# Patient Record
Sex: Male | Born: 1980 | Hispanic: Yes | Marital: Single | Smoking: Never smoker
Health system: Southern US, Community
[De-identification: ages and names within clinical notes are randomized; demographics above are authoritative.]

## PROBLEM LIST (undated history)

## (undated) HISTORY — PX: REPAIR KNEE LIGAMENT: SUR1188

---

## 2008-03-05 ENCOUNTER — Emergency Department: Payer: Self-pay | Admitting: Internal Medicine

## 2008-03-15 ENCOUNTER — Emergency Department: Payer: Self-pay | Admitting: Emergency Medicine

## 2008-09-06 IMAGING — CR RIGHT INDEX FINGER 2+V
1 series · 6 of 6 positions shown · non-contrast
Comparison: none

REASON FOR EXAM: injury from a moving machine laceration noted mc 4
COMMENTS:

[Series 1: view not recorded · 0.17mm/px · 6 of 6 slices shown]
[im 1/6]
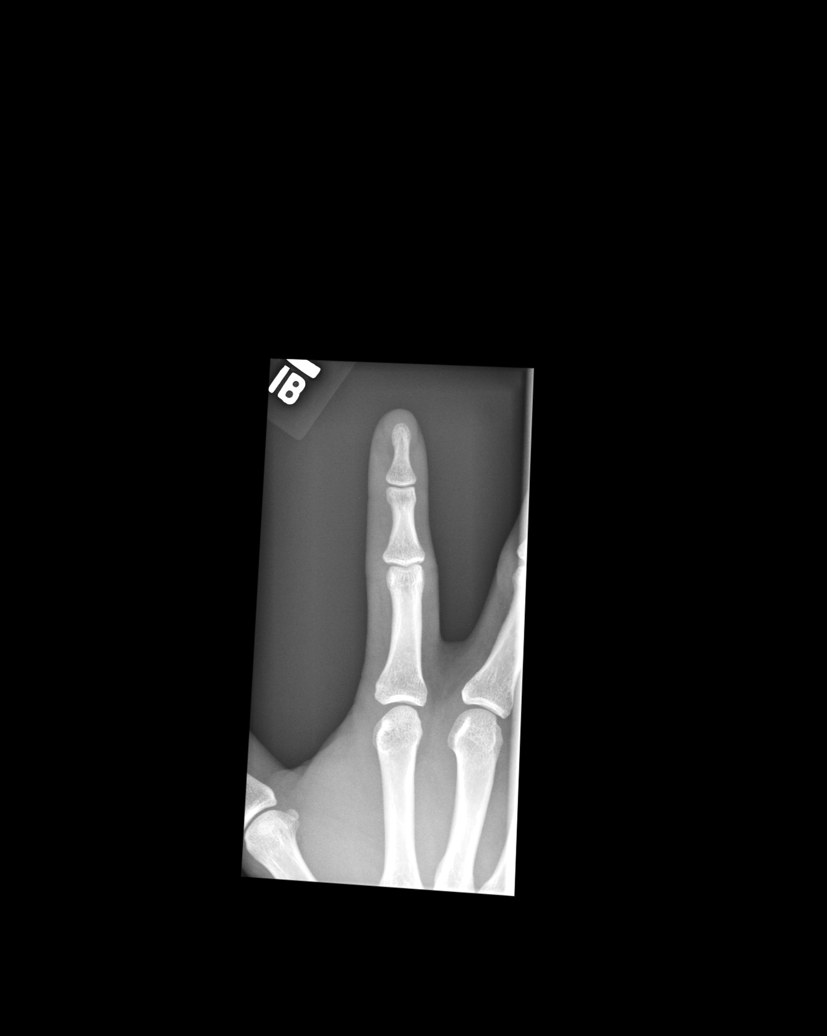
[im 2/6]
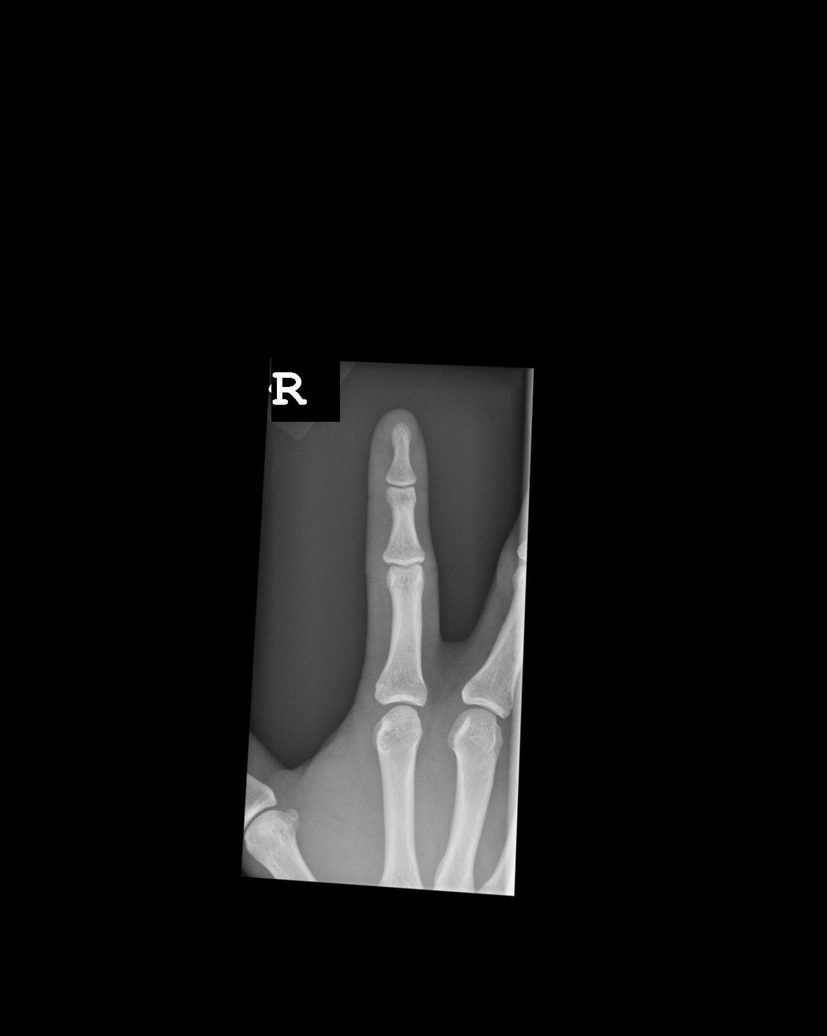
[im 3/6]
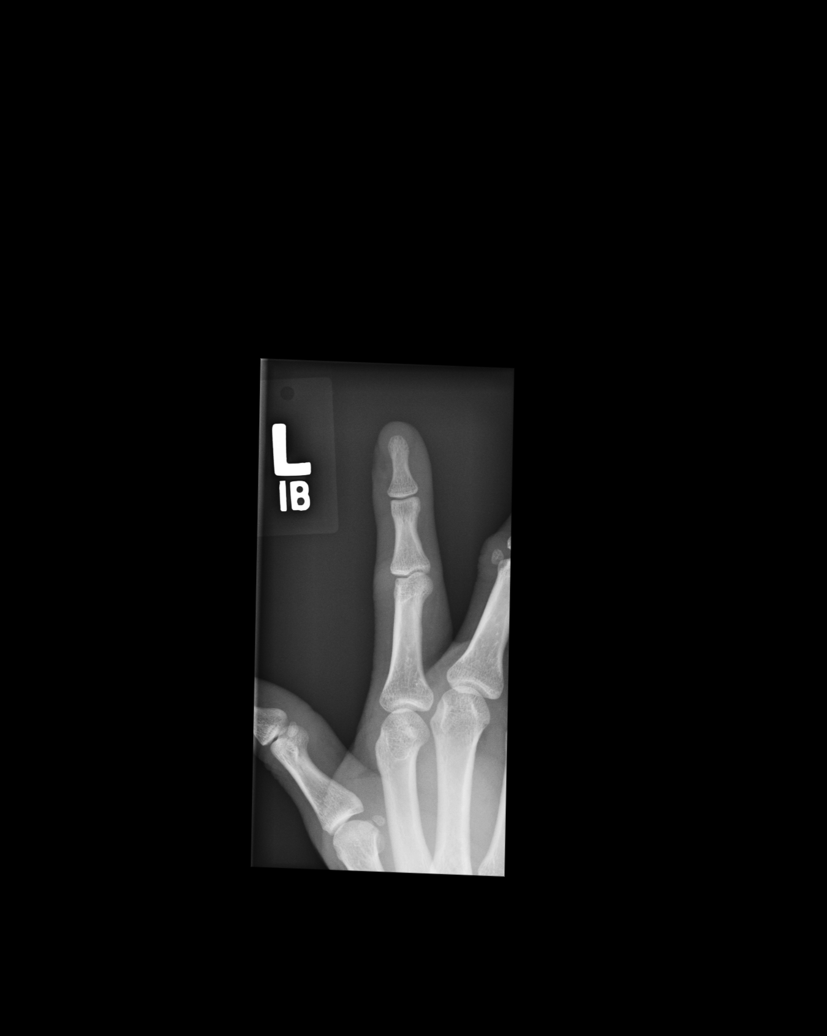
[im 4/6]
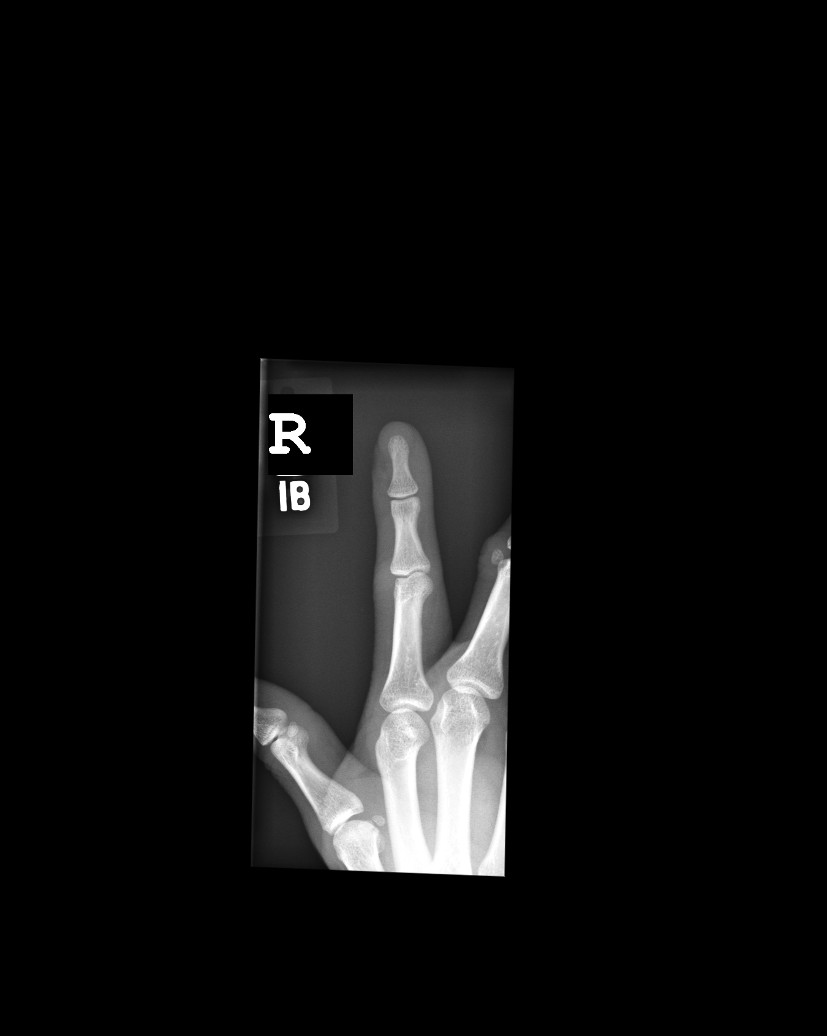
[im 5/6]
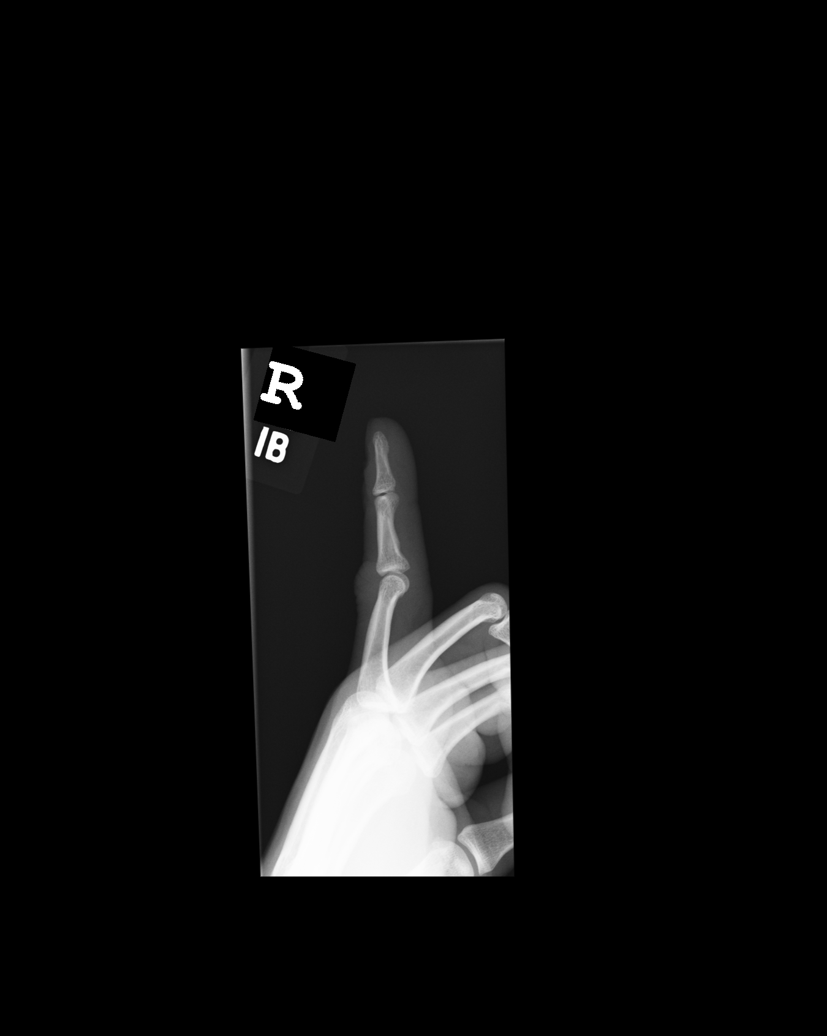
[im 6/6]
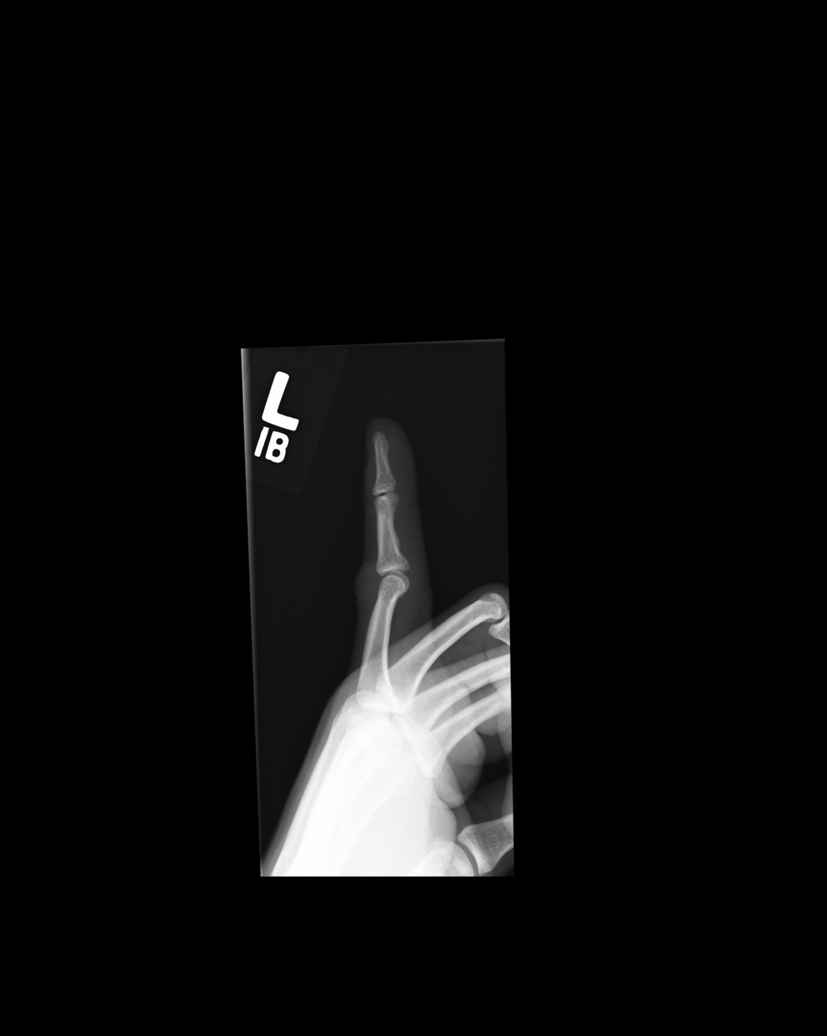

[6 of 6 positions shown; findings below may reference images not displayed]

PROCEDURE:     DXR - DXR FINGER INDEX 2ND DIGIT RT HA  - March 05, 2008 [DATE]

RESULT:     Three views of the RIGHT index finger were obtained. No
fracture, dislocation or other acute bony abnormality is seen. There is
apparent soft tissue swelling about the PIP joint.

In PACS, the views were initially labeled LEFT but have been relabeled RIGHT.
IMPRESSION: 1.     No significant osseous abnormalities are noted.

## 2015-04-26 ENCOUNTER — Other Ambulatory Visit: Payer: Self-pay

## 2015-04-26 ENCOUNTER — Emergency Department
Admission: EM | Admit: 2015-04-26 | Discharge: 2015-04-26 | Disposition: A | Discharge status: Home / Self Care | Payer: Self-pay | Attending: Emergency Medicine | Admitting: Emergency Medicine

## 2015-04-26 ENCOUNTER — Emergency Department: Payer: Self-pay

## 2015-04-26 ENCOUNTER — Encounter: Payer: Self-pay | Admitting: Emergency Medicine

## 2015-04-26 DIAGNOSIS — J399 Disease of upper respiratory tract, unspecified: Secondary | ICD-10-CM

## 2015-04-26 DIAGNOSIS — J159 Unspecified bacterial pneumonia: Secondary | ICD-10-CM | POA: Insufficient documentation

## 2015-04-26 DIAGNOSIS — R000 Tachycardia, unspecified: Secondary | ICD-10-CM | POA: Insufficient documentation

## 2015-04-26 DIAGNOSIS — J069 Acute upper respiratory infection, unspecified: Secondary | ICD-10-CM | POA: Insufficient documentation

## 2015-04-26 DIAGNOSIS — J189 Pneumonia, unspecified organism: Secondary | ICD-10-CM

## 2015-04-26 LAB — CBC WITH DIFFERENTIAL/PLATELET
BASOS ABS: 0.1 10*3/uL (ref 0–0.1)
BASOS PCT: 1 %
EOS ABS: 0.5 10*3/uL (ref 0–0.7)
EOS PCT: 6 %
HCT: 47.3 % (ref 40.0–52.0)
Hemoglobin: 16.3 g/dL (ref 13.0–18.0)
Lymphocytes Relative: 18 %
Lymphs Abs: 1.5 10*3/uL (ref 1.0–3.6)
MCH: 31 pg (ref 26.0–34.0)
MCHC: 34.5 g/dL (ref 32.0–36.0)
MCV: 89.9 fL (ref 80.0–100.0)
MONO ABS: 0.7 10*3/uL (ref 0.2–1.0)
MONOS PCT: 9 %
NEUTROS ABS: 5.5 10*3/uL (ref 1.4–6.5)
Neutrophils Relative %: 66 %
Platelets: 220 10*3/uL (ref 150–440)
RBC: 5.26 MIL/uL (ref 4.40–5.90)
RDW: 12.8 % (ref 11.5–14.5)
WBC: 8.3 10*3/uL (ref 3.8–10.6)

## 2015-04-26 LAB — BASIC METABOLIC PANEL
ANION GAP: 8 (ref 5–15)
BUN: 17 mg/dL (ref 6–20)
CALCIUM: 9 mg/dL (ref 8.9–10.3)
CO2: 26 mmol/L (ref 22–32)
Chloride: 102 mmol/L (ref 101–111)
Creatinine, Ser: 0.96 mg/dL (ref 0.61–1.24)
GFR calc Af Amer: >60 mL/min (ref >60)
GFR calc non Af Amer: >60 mL/min (ref >60)
Glucose, Bld: 96 mg/dL (ref 65–99)
Potassium: 4.3 mmol/L (ref 3.5–5.1)
Sodium: 136 mmol/L (ref 135–145)

## 2015-04-26 MED ORDER — IPRATROPIUM-ALBUTEROL 0.5-2.5 (3) MG/3ML IN SOLN
3.0000 mL | Freq: Once | RESPIRATORY_TRACT | Status: AC
  Administered 2015-04-26: 3 mL via RESPIRATORY_TRACT

## 2015-04-26 MED ORDER — AZITHROMYCIN 250 MG PO TABS
ORAL_TABLET | ORAL | Status: AC
  Administered 2015-04-26: 500 mg via ORAL
  Filled 2015-04-26: qty 2

## 2015-04-26 MED ORDER — CEFTRIAXONE SODIUM 1 G IJ SOLR
1.0000 g | Freq: Once | INTRAMUSCULAR | Status: AC
  Administered 2015-04-26: 1 g via INTRAVENOUS

## 2015-04-26 MED ORDER — DEXAMETHASONE SODIUM PHOSPHATE 10 MG/ML IJ SOLN
10.0000 mg | Freq: Once | INTRAMUSCULAR | Status: AC
  Administered 2015-04-26: 10 mg via INTRAVENOUS

## 2015-04-26 MED ORDER — ALBUTEROL SULFATE HFA 108 (90 BASE) MCG/ACT IN AERS: 2.0000 | INHALATION_SPRAY | Freq: Four times a day (QID) | RESPIRATORY_TRACT | Status: AC | PRN

## 2015-04-26 MED ORDER — AZITHROMYCIN 250 MG PO TABS: ORAL_TABLET | ORAL | Status: AC

## 2015-04-26 MED ORDER — BENZONATATE 100 MG PO CAPS: 200.0000 mg | ORAL_CAPSULE | Freq: Three times a day (TID) | ORAL | Status: AC | PRN

## 2015-04-26 MED ORDER — DIPHENHYDRAMINE HCL 50 MG/ML IJ SOLN
INTRAMUSCULAR | Status: AC
  Administered 2015-04-26: 25 mg via INTRAVENOUS
  Filled 2015-04-26: qty 1

## 2015-04-26 MED ORDER — DIPHENHYDRAMINE HCL 50 MG/ML IJ SOLN
25.0000 mg | Freq: Once | INTRAMUSCULAR | Status: AC
  Administered 2015-04-26: 25 mg via INTRAVENOUS

## 2015-04-26 MED ORDER — DEXTROSE 5 % IV SOLN
INTRAVENOUS | Status: AC
  Administered 2015-04-26: 1 g via INTRAVENOUS
  Filled 2015-04-26: qty 10

## 2015-04-26 MED ORDER — PREDNISONE 20 MG PO TABS: 40.0000 mg | ORAL_TABLET | Freq: Every day | ORAL | Status: AC

## 2015-04-26 MED ORDER — AZITHROMYCIN 250 MG PO TABS
500.0000 mg | ORAL_TABLET | Freq: Once | ORAL | Status: AC
  Administered 2015-04-26: 500 mg via ORAL

## 2015-04-26 MED ORDER — IPRATROPIUM-ALBUTEROL 0.5-2.5 (3) MG/3ML IN SOLN
RESPIRATORY_TRACT | Status: AC
  Administered 2015-04-26: 3 mL via RESPIRATORY_TRACT
  Filled 2015-04-26: qty 3

## 2015-04-26 MED ORDER — DEXAMETHASONE SODIUM PHOSPHATE 10 MG/ML IJ SOLN
INTRAMUSCULAR | Status: AC
  Administered 2015-04-26: 10 mg via INTRAVENOUS
  Filled 2015-04-26: qty 1

## 2015-04-26 NOTE — ED Notes (Signed)
Pt c/o shortness of breath for a month with symptoms worsening over the past two weeks.  He is not aware of a history of asthma.  Respiratory assessment revealed expiratory wheezes bilaterally.  O2 sat 98% on room air, pulse 123.  Pt has not taken medication for his symptoms.

## 2015-04-26 NOTE — ED Provider Notes (Signed)
CSN: [id number]     Arrival date & time 04/26/15  1334 History   None    Chief Complaint  Patient presents with  . Shortness of Breath     (Consider location/radiation/quality/duration/timing/severity/associated sxs/prior Treatment) HPI patient has a worsening cough over the last month has developed worsening shortness of breath for the last 2 weeks productive cough and some fever chills sweats went to an urgent care today was given 2 breathing treatments did not clear up they recommended he come to the ER denies any medical history denies anything making this better or worse does have a history of seasonal allergies which he believes started all of this and did not seem to get any better with over-the-counter medications no other associated signs or symptoms except for previously noted  History reviewed. No pertinent past medical history. Past Surgical History  Procedure Laterality Date  . Repair knee ligament     No family history on file. History  Substance Use Topics  . Smoking status: Never Smoker   . Smokeless tobacco: Never Used  . Alcohol Use: No    Review of Systems   Constitutional: Subjective fever and sweats Eyes: No visual changes. ENT: No sore throat. Cardiovascular: Denies chest pain. Respiratory: Cough over the last month Gastrointestinal: No abdominal pain.  No nausea, no vomiting.  No diarrhea.  No constipation. Genitourinary: Negative for dysuria. Musculoskeletal: Negative for back pain. Skin: Negative for rash. Neurological: Negative for headaches, focal weakness or numbness.  10-point ROS otherwise negative.  Allergies  Review of patient's allergies indicates no known allergies.  Home Medications   Prior to Admission medications   Medication Sig Start Date End Date Taking? Authorizing Provider  albuterol (PROVENTIL HFA;VENTOLIN HFA) 108 (90 BASE) MCG/ACT inhaler Inhale 2 puffs into the lungs every 6 (six) hours as needed for wheezing or shortness  of breath. 04/26/15   Taina Landry Kristine GarbeWilliam C Tamlyn Sides, PA-C  azithromycin (ZITHROMAX Z-PAK) 250 MG tablet followed by 1 tablet (250 mg) once daily on Days 2 through 5. 04/26/15 05/01/15  Jacier Gladu Kristine GarbeWilliam C Hatsuko Bizzarro, PA-C  benzonatate (TESSALON PERLES) 100 MG capsule Take 2 capsules (200 mg total) by mouth 3 (three) times daily as needed for cough. 04/26/15 04/25/16  Aleks Nawrot William C Taiwo Fish, PA-C  predniSONE (DELTASONE) 20 MG tablet Take 2 tablets (40 mg total) by mouth daily. 04/26/15 04/25/16  Richards Pherigo William C Daimian Sudberry, PA-C   BP 135/95 mmHg  Pulse 123  Temp(Src) 98.6 F (37 C) (Oral)  Resp 20  Ht 5\' 5"  (1.651 m)  Wt 150 lb (68.04 kg)  BMI 24.96 kg/m2  SpO2 93% Physical Exam  Alert male appearing stated age he is well-developed well-nourished in no acute distress sitting comfortably in the room vital shown to have an elevated heart rate and diminished O2 sats per the nurse's note Head ears eyes nose neck and throat exam was unremarkable Cardiovascular patient is tachycardic otherwise regular  rhythm no murmurs rubs or gallops Pulmonary patient has diminished breath sounds throughout all fields wheezing throughout all fields Skin is warm and dry. Color free of rash or disease Musculoskeletal moving all extremities without difficulty patient has no pedal edema good peripheral pulses Neuro exam nonfocal cranial nerves II through XII grossly intact Psychologically patient is acting appropriately calm  ED Course  Procedures (including critical care time) Labs Review Labs Reviewed  CBC WITH DIFFERENTIAL/PLATELET  BASIC METABOLIC PANEL    Imaging Review Dg Chest 2 View  04/26/2015   CLINICAL DATA:  Cough and shortness  of breath 1 month.  EXAM: CHEST  2 VIEW  COMPARISON:  None.  FINDINGS: Mild prominence of the suprahilar markings right worse than left. No definite focal consolidation or effusion. Cardiac silhouette is within normal. Remaining bones and soft tissues are normal.  IMPRESSION: Mild prominence of the  suprahilar markings right worse than left as cannot exclude focal pneumonia versus asthma/bronchitis and less likely vascular congestion.   Electronically Signed   By: Elberta Fortisaniel  Boyle M.D.   On: 04/26/2015 15:41    EKG showed a sinus tachycardia with a rate 110 bpm normal axis  MDM  Initial impression on this patient is reactive airway disease concern for community-acquired pneumonia given the patient's productive cough worsening cough ejected fever sweats radiology findings we are going to go and cover him for community acquired pneumonia given Rocephin and azithromycin here and will be discharged on azithromycin as well as an albuterol inhaler is to follow-up in 3-5 days if not improving return here for any acute concerns or worsening symptoms Final diagnoses:  Upper respiratory disease  Community acquired pneumonia        Jacobey Gura Rosalyn GessWilliam C Kenyen Candy, PA-C 04/26/15 1730  Sharyn CreamerMark Quale, MD 04/26/15 2334

## 2015-04-26 NOTE — ED Notes (Signed)
D/C instructions given and reviewed. Pt verbalized an understanding and has no additional questions or concerns at this time. Pt ambulating independently. 

## 2020-03-15 ENCOUNTER — Ambulatory Visit: Payer: Self-pay | Attending: Internal Medicine

## 2020-03-15 DIAGNOSIS — Z23 Encounter for immunization: Secondary | ICD-10-CM

## 2020-03-15 NOTE — Progress Notes (Signed)
   Covid-19 Vaccination Clinic  Name:  Densil Ottey    MRN: [id number] DOB: [birth year]  03/15/2020  Mr. Darco was observed post Covid-19 immunization for 15 minutes without incident. He was provided with Vaccine Information Sheet and instruction to access the V-Safe system.   Mr. Beeney was instructed to call 911 with any severe reactions post vaccine: Marland Kitchen Difficulty breathing  . Swelling of face and throat  . A fast heartbeat  . A bad rash all over body  . Dizziness and weakness   Immunizations Administered    Name Date Dose VIS Date Route   Pfizer COVID-19 Vaccine 03/15/2020  9:34 AM 0.3 mL 11/23/2019 Intramuscular   Manufacturer: [employer], [employer]   Lot: [id number]   NDC: [address]-1

## 2020-04-05 ENCOUNTER — Ambulatory Visit: Payer: Self-pay | Attending: Internal Medicine

## 2020-04-05 DIAGNOSIS — Z23 Encounter for immunization: Secondary | ICD-10-CM

## 2020-04-05 NOTE — Progress Notes (Signed)
   Covid-19 Vaccination Clinic  Name:  Samad Thon    MRN: [id number] DOB: 1981-09-07  04/05/2020  Mr. Sites was observed post Covid-19 immunization for 15 minutes without incident. He was provided with Vaccine Information Sheet and instruction to access the V-Safe system.   Mr. Eads was instructed to call 911 with any severe reactions post vaccine: Marland Kitchen Difficulty breathing  . Swelling of face and throat  . A fast heartbeat  . A bad rash all over body  . Dizziness and weakness   Immunizations Administered    Name Date Dose VIS Date Route   Pfizer COVID-19 Vaccine 04/05/2020  9:22 AM 0.3 mL 02/06/2019 Intramuscular   Manufacturer: [employer], [employer]   Lot: [id number]   NDC: [address]-1
